# Patient Record
Sex: Female | Born: 1985 | Race: White | Hispanic: No | Smoking: Former smoker
Health system: Southern US, Community
[De-identification: ages and names within clinical notes are randomized; demographics above are authoritative.]

## PROBLEM LIST (undated history)

## (undated) ENCOUNTER — Inpatient Hospital Stay (HOSPITAL_COMMUNITY): Payer: Self-pay

## (undated) DIAGNOSIS — D649 Anemia, unspecified: Secondary | ICD-10-CM

## (undated) DIAGNOSIS — F411 Generalized anxiety disorder: Secondary | ICD-10-CM

## (undated) DIAGNOSIS — F32A Depression, unspecified: Secondary | ICD-10-CM

## (undated) HISTORY — PX: NO PAST SURGERIES: SHX2092

## (undated) HISTORY — DX: Generalized anxiety disorder: F41.1

## (undated) HISTORY — DX: Depression, unspecified: F32.A

---

## 2014-08-01 NOTE — L&D Delivery Note (Signed)
Delivery Note At 5:31 PM a viable female was delivered via  (Presentation: ;  ).  APGAR: , ; weight  .   Placenta status: , .  Cord:  with the following complications: .  Cord pH: not done  Anesthesia:   Episiotomy:   Lacerations:   Suture Repair: 2.0 vicryl Est. Blood Loss (mL):    Mom to postpartum.  Baby to Couplet care / Skin to Skin.  MARSHALL,BERNARD A 02/06/2015, 5:40 PM

## 2014-08-20 LAB — OB RESULTS CONSOLE RPR: RPR: NONREACTIVE

## 2014-08-20 LAB — OB RESULTS CONSOLE ABO/RH: RH Type: POSITIVE

## 2014-08-20 LAB — OB RESULTS CONSOLE GC/CHLAMYDIA
Chlamydia: NEGATIVE
Gonorrhea: NEGATIVE

## 2014-08-20 LAB — OB RESULTS CONSOLE RUBELLA ANTIBODY, IGM: RUBELLA: IMMUNE

## 2014-08-20 LAB — OB RESULTS CONSOLE HIV ANTIBODY (ROUTINE TESTING): HIV: NONREACTIVE

## 2014-08-20 LAB — OB RESULTS CONSOLE HEPATITIS B SURFACE ANTIGEN: HEP B S AG: NEGATIVE

## 2014-08-20 LAB — OB RESULTS CONSOLE ANTIBODY SCREEN: Antibody Screen: NEGATIVE

## 2014-12-30 LAB — OB RESULTS CONSOLE GBS: GBS: NEGATIVE

## 2015-02-03 ENCOUNTER — Inpatient Hospital Stay (HOSPITAL_COMMUNITY)
Admission: AD | Admit: 2015-02-03 | Discharge: 2015-02-03 | Disposition: A | Discharge status: Home / Self Care | Payer: Medicaid Other | Source: Ambulatory Visit | Attending: Obstetrics | Admitting: Obstetrics

## 2015-02-03 ENCOUNTER — Encounter (HOSPITAL_COMMUNITY): Payer: Self-pay | Admitting: *Deleted

## 2015-02-03 ENCOUNTER — Inpatient Hospital Stay (HOSPITAL_COMMUNITY): Payer: Medicaid Other

## 2015-02-03 DIAGNOSIS — Z3A40 40 weeks gestation of pregnancy: Secondary | ICD-10-CM | POA: Diagnosis not present

## 2015-02-03 DIAGNOSIS — O368130 Decreased fetal movements, third trimester, not applicable or unspecified: Secondary | ICD-10-CM

## 2015-02-03 DIAGNOSIS — O368190 Decreased fetal movements, unspecified trimester, not applicable or unspecified: Secondary | ICD-10-CM

## 2015-02-03 HISTORY — DX: Anemia, unspecified: D64.9

## 2015-02-03 IMAGING — US US FETAL BPP W/O NONSTRESS
1 series · 14 of 28 positions shown · non-contrast
Comparison: none

[Series 1: us fetal bpp w/o nonstress · non-contrast · 35 acquisitions, 14 frames shown]
[im 2/35]
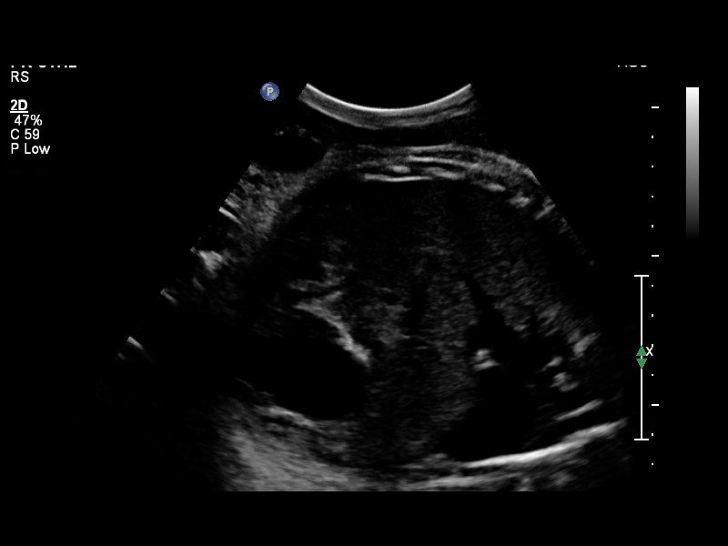
[im 4/35]
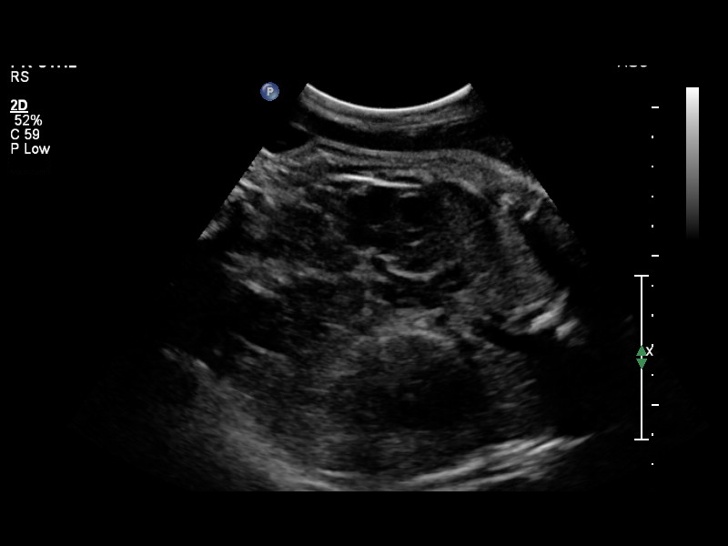
[im 7/35]
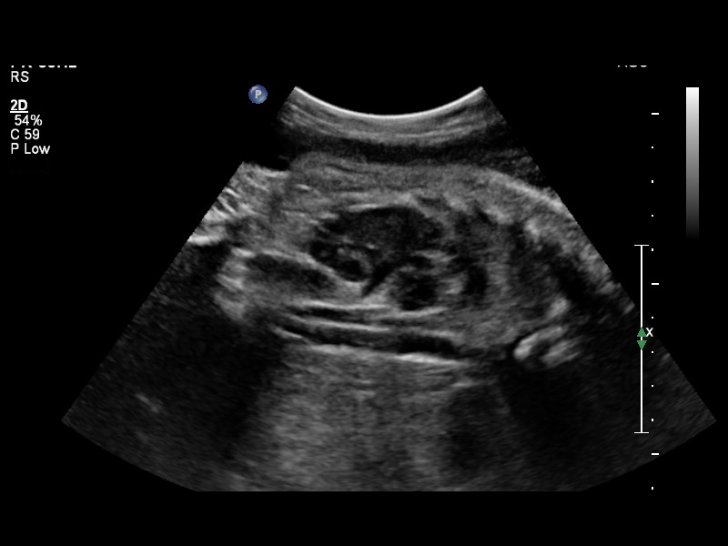
[im 9/35]
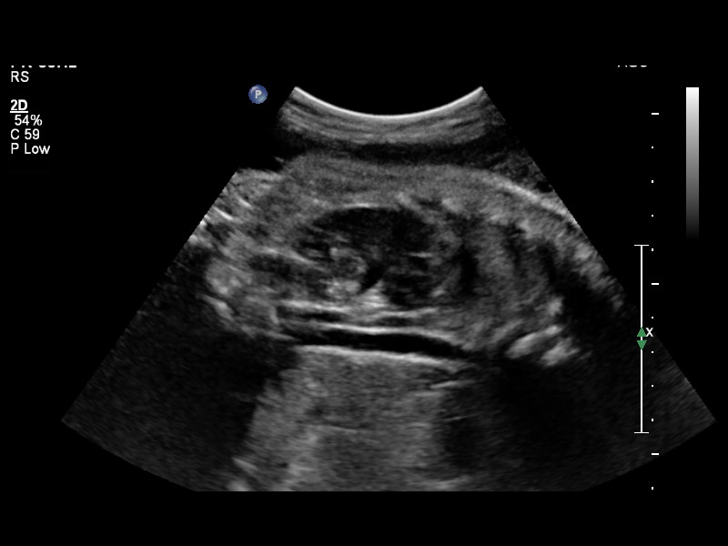
[im 12/35]
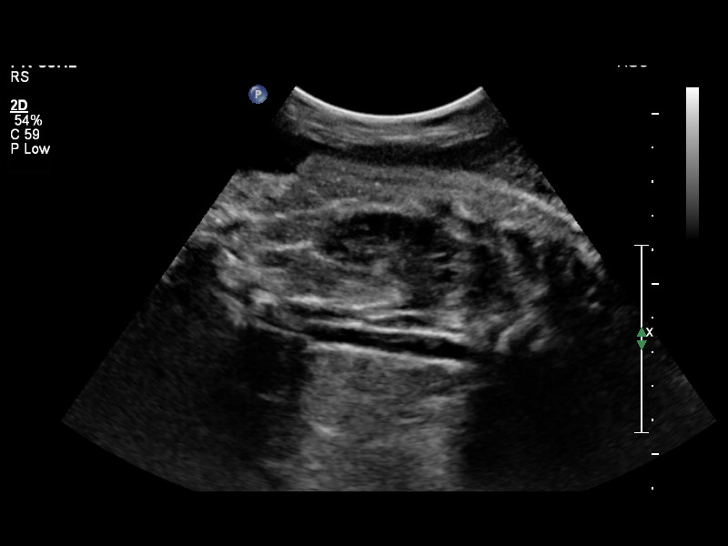
[im 14/35]
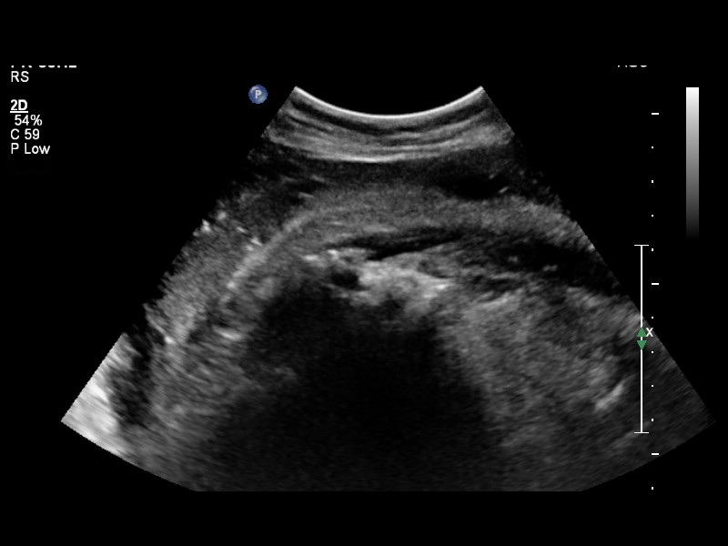
[im 17/35]
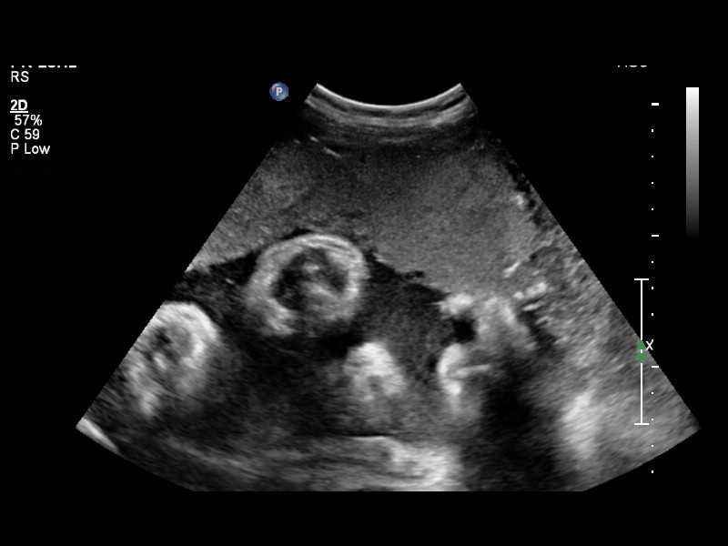
[im 19/35]
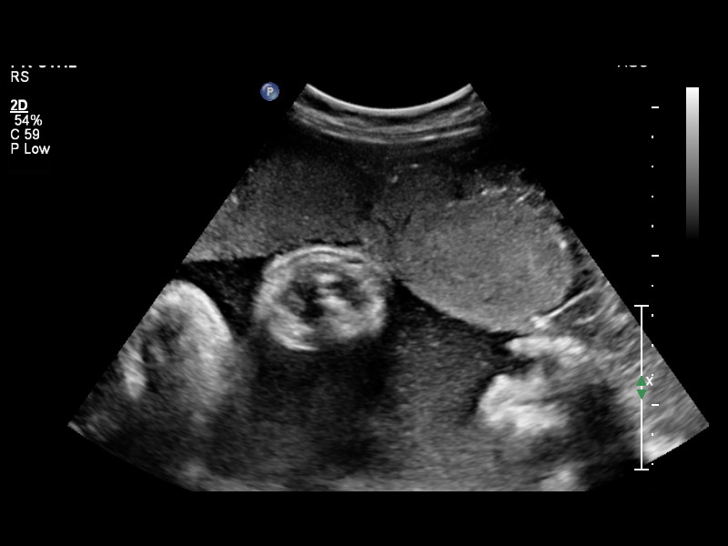
[im 22/35]
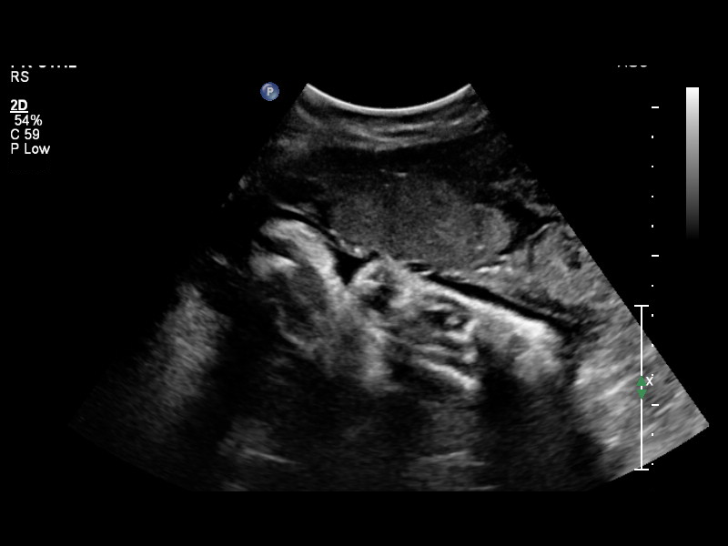
[im 24/35]
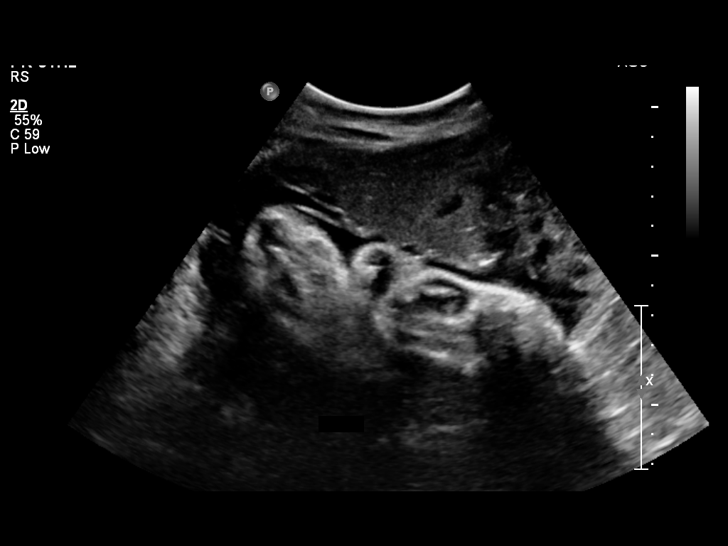
[im 27/35]
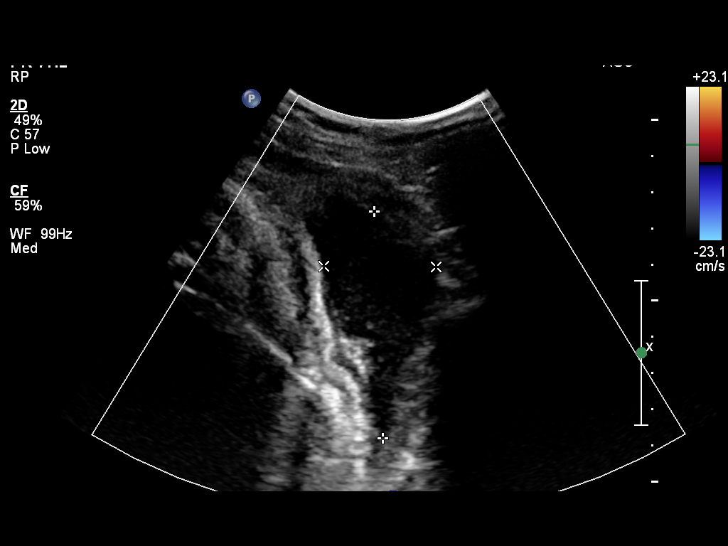
[im 29/35]
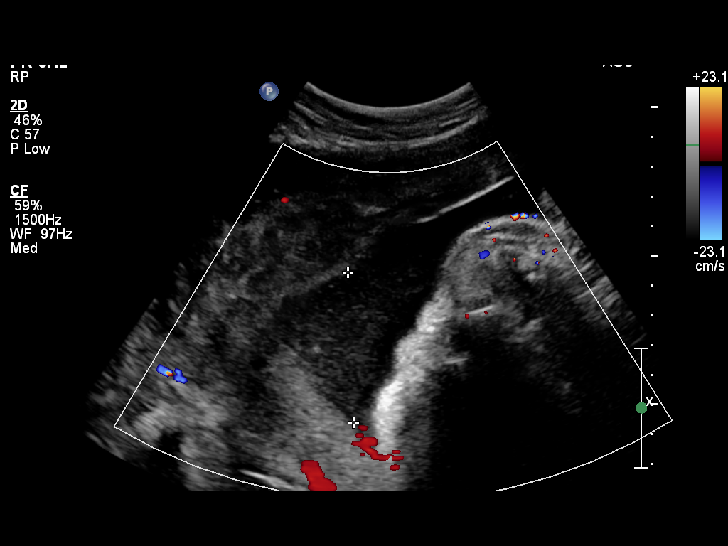
[im 32/35]
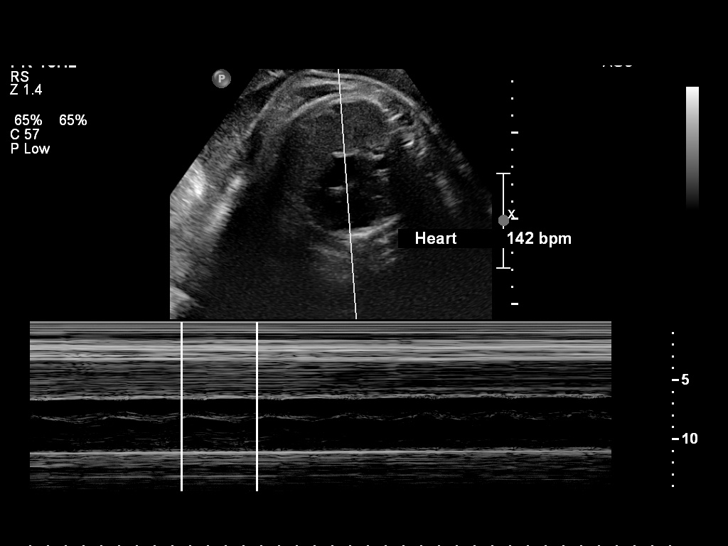
[im 35/35]
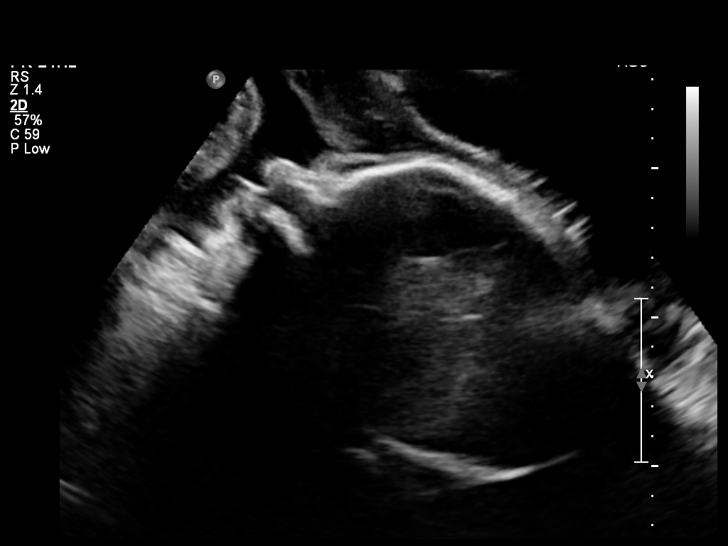

[14 of 28 positions shown; findings below may reference images not displayed]

OBSTETRICS REPORT
(Signed Final [DATE] [DATE])

Service(s) Provided

Indications

40 weeks gestation of pregnancy
Decreased fetal movements, third trimester,           [TS]
unspecified
Postdate pregnancy (40-42 weeks)                      [TS]
Fetal Evaluation

Num Of Fetuses:    1
Fetal Heart Rate:  142                          bpm
Cardiac Activity:  Observed
Presentation:      Cephalic

Amniotic Fluid
AFI FV:      Subjectively within normal limits
AFI Sum:     16.22    cm      70  %Tile      Larg Pckt:   6.23  cm
RUQ:   4.96    cm   RLQ:    5.03   cm    LLQ:    6.23   cm
Biophysical Evaluation

Amniotic F.V:   Pocket => 2 cm two         F. Tone:         Observed
planes
F. Movement:    Observed                   Score:           [DATE]
F. Breathing:   Observed
Gestational Age

Clinical EDD:  40w 1d                                        EDD:    [DATE]
Best:          40w 1d     Det. By:  Clinical EDD             EDD:    [DATE]
Impression

SIUP at 40+1 weeks
Cephalic presentation
Normal amniotic fluid volume
BPP [DATE]
Recommendations

Follow-up as clinically indicated
questions or concerns.

## 2015-02-03 NOTE — MAU Provider Note (Signed)
  History     CSN: [id number]  Arrival date and time: 02/03/15 1655   First Provider Initiated Contact with Patient 02/03/15 1732      Chief Complaint  Patient presents with  . Decreased Fetal Movement   HPI  29 y.o. [id number] @[redacted]w[redacted]d  presents to the MAU stating that she has not felt the baby move all day until she came to the hospital. Denies vaginal bleeding, LOF, contractions.  Past Medical History  Diagnosis Date  . Anemia     Past Surgical History  Procedure Laterality Date  . No past surgeries      History reviewed. No pertinent family history.  History  Substance Use Topics  . Smoking status: Never Smoker   . Smokeless tobacco: Not on file  . Alcohol Use: No    Allergies: No Known Allergies  Prescriptions prior to admission  Medication Sig Dispense Refill Last Dose  . calcium carbonate (TUMS - DOSED IN MG ELEMENTAL CALCIUM) 500 MG chewable tablet Chew 2 tablets by mouth 2 (two) times daily as needed for indigestion or heartburn.   Past Week at Unknown time  . ferrous sulfate 325 (65 FE) MG tablet Take 325 mg by mouth daily with supper.   02/02/2015 at Unknown time  . Prenatal Vit-Fe Fumarate-FA (PRENATAL MULTIVITAMIN) TABS tablet Take 1 tablet by mouth daily with supper.   02/02/2015 at Unknown time    Review of Systems  Constitutional: Negative for fever.  Genitourinary:       Decreased fetal movement  All other systems reviewed and are negative.  Physical Exam   Blood pressure 106/61, pulse 89, temperature 98.9 F (37.2 C), temperature source Oral, resp. rate 20.  Physical Exam  Nursing note and vitals reviewed. Constitutional: She is oriented to person, place, and time. She appears well-developed and well-nourished. No distress.  HENT:  Head: Normocephalic and atraumatic.  Cardiovascular: Normal rate.   Respiratory: Effort normal. No respiratory distress.  Musculoskeletal: Normal range of motion.  Neurological: She is alert and oriented to person,  place, and time.  Skin: Skin is warm and dry.  Psychiatric: She has a normal mood and affect. Her behavior is normal. Judgment and thought content normal.    MAU Course  Procedures MDM Nst- Reactive Category 1 occ mild variables . Reported to Dr. Gaynell FaceMarshall. Bpp 8/8  Assessment and Plan  Decreased fetal movement  Discharge to home  [facility] HospitalClemmons,Ercie Eliasen Grissett 02/03/2015, 5:46 PM

## 2015-02-03 NOTE — Discharge Instructions (Signed)
Fetal Biophysical Profile °This is a test that measures five different variables of the fetus: Heart rate, breathing movement, total movement of the baby, fetal muscle tone, the amount of amniotic fluid, and the heart rate activity of the fetus. The five variables are measured individually and contribute either a 2 or a 0 to the overall scoring of the test. The measurements are as follows: °· Fetal heart rate activity. This is measured and scored in the same way as a non-stress test. The fetal heart rate is considered reactive when there are movement-associated fetal heart rate increases of at least 15 beats per minute above baseline, and 15 seconds in duration over a 20-minute period. A score of 2 is given for reactivity, and a score of 0 indicates that the fetal heart rate is non-reactive. °· Fetal breathing movements. This is scored based on fetal breathing movements and indicate fetal well-being. Their absence may indicate a low oxygen level for the fetus. Fetal breathing increases in frequency and uniformity after the 36th week of pregnancy. To earn a score of 2, the fetus must have at least one episode of fetal breathing lasting at least 60 seconds within a 30-minute observation. Absence of this breathing is scored a 0 on the BPP. °· Fetal body movements. Fetal activity is a reflection of brain integrity and function. The presence of at least three episodes of fetal movements within a 30-minute period is given a score of 2. A score of 0 is given with two or less movements in this time period. Fetal activity is highest 1 to 3 hours after the mother has eaten a meal. °· Fetal tone. In the uterus, the fetus is normally in a position of flexion. This means the head is bent down towards the knees. The fetus also stretches, rolls, and moves in the uterus. The arms, legs, trunk, and head may be flexed and extended. A score of 2 is earned when there is at least one episode of active extension with return flexion. A  score of 0 is given for slow extension with a return to only partial flexion. Fetal movement not followed by return to flexion, limbs or spine in extension, and an open fetal hand score 0. °· Amniotic fluid volume. Amniotic fluid volume has been demonstrated to be a good method of predicting fetal distress. Too little amniotic fluid has been associated with fetal abnormalities, slow uterine growth, and over due pregnancy. A score of 2 is given for this when there is at least one pocket of amniotic fluid that measures 1 cm in a specific area. A score of 0 indicates either that fluid is absent in most areas of the uterine cavity or that the largest pocket of fluid measures less than 1 cm. °PREPARATION FOR TEST °No preparation or fasting is necessary. °NORMAL FINDINGS °1. A score of 8-10 points (if amniotic fluid volume is adequate). °2. Possible critical values: Less than 4 may necessitate immediate delivery of fetus. °Ranges for normal findings may vary among different laboratories and hospitals. You should always check with your doctor after having lab work or other tests done to discuss the meaning of your test results and whether your values are considered within normal limits. °MEANING OF TEST  °Your caregiver will go over the test results with you and discuss the importance and meaning of your results, as well as treatment options and the need for additional tests if necessary. °OBTAINING THE TEST RESULTS  °It is your responsibility to obtain your test   results. Ask the lab or department performing the test when and how you will get your results. °Document Released: 11/18/2004 Document Revised: 10/10/2011 Document Reviewed: 06/27/2008 °[employer]® Patient Information ©2015 [employer]. This information is not intended to replace advice given to you by your health care provider. Make sure you discuss any questions you have with your health care provider. ° °Fetal Movement Counts °Patient Name:  __________________________________________________ Patient Due Date: ____________________ °Performing a fetal movement count is highly recommended in high-risk pregnancies, but it is good for every pregnant woman to do. Your health care provider may ask you to start counting fetal movements at 28 weeks of the pregnancy. Fetal movements often increase: °· After eating a full meal. °· After physical activity. °· After eating or drinking something sweet or cold. °· At rest. °Pay attention to when you feel the baby is most active. This will help you notice a pattern of your baby's sleep and wake cycles and what factors contribute to an increase in fetal movement. It is important to perform a fetal movement count at the same time each day when your baby is normally most active.  °HOW TO COUNT FETAL MOVEMENTS °3. Find a quiet and comfortable area to sit or lie down on your left side. Lying on your left side provides the best blood and oxygen circulation to your baby. °4. Write down the day and time on a sheet of paper or in a journal. °5. Start counting kicks, flutters, swishes, rolls, or jabs in a 2-hour period. You should feel at least 10 movements within 2 hours. °6. If you do not feel 10 movements in 2 hours, wait 2-3 hours and count again. Look for a change in the pattern or not enough counts in 2 hours. °SEEK MEDICAL CARE IF: °· You feel less than 10 counts in 2 hours, tried twice. °· There is no movement in over an hour. °· The pattern is changing or taking longer each day to reach 10 counts in 2 hours. °· You feel the baby is not moving as he or she usually does. °Date: ____________ Movements: ____________ Start time: ____________ Finish time: ____________  °Date: ____________ Movements: ____________ Start time: ____________ Finish time: ____________ °Date: ____________ Movements: ____________ Start time: ____________ Finish time: ____________ °Date: ____________ Movements: ____________ Start time: ____________  Finish time: ____________ °Date: ____________ Movements: ____________ Start time: ____________ Finish time: ____________ °Date: ____________ Movements: ____________ Start time: ____________ Finish time: ____________ °Date: ____________ Movements: ____________ Start time: ____________ Finish time: ____________ °Date: ____________ Movements: ____________ Start time: ____________ Finish time: ____________  °Date: ____________ Movements: ____________ Start time: ____________ Finish time: ____________ °Date: ____________ Movements: ____________ Start time: ____________ Finish time: ____________ °Date: ____________ Movements: ____________ Start time: ____________ Finish time: ____________ °Date: ____________ Movements: ____________ Start time: ____________ Finish time: ____________ °Date: ____________ Movements: ____________ Start time: ____________ Finish time: ____________ °Date: ____________ Movements: ____________ Start time: ____________ Finish time: ____________ °Date: ____________ Movements: ____________ Start time: ____________ Finish time: ____________  °Date: ____________ Movements: ____________ Start time: ____________ Finish time: ____________ °Date: ____________ Movements: ____________ Start time: ____________ Finish time: ____________ °Date: ____________ Movements: ____________ Start time: ____________ Finish time: ____________ °Date: ____________ Movements: ____________ Start time: ____________ Finish time: ____________ °Date: ____________ Movements: ____________ Start time: ____________ Finish time: ____________ °Date: ____________ Movements: ____________ Start time: ____________ Finish time: ____________ °Date: ____________ Movements: ____________ Start time: ____________ Finish time: ____________  °Date: ____________ Movements: ____________ Start time: ____________ Finish time: ____________ °Date: ____________ Movements: ____________ Start time: ____________   Finish time: ____________ °Date: ____________  Movements: ____________ Start time: ____________ Finish time: ____________ °Date: ____________ Movements: ____________ Start time: ____________ Finish time: ____________ °Date: ____________ Movements: ____________ Start time: ____________ Finish time: ____________ °Date: ____________ Movements: ____________ Start time: ____________ Finish time: ____________ °Date: ____________ Movements: ____________ Start time: ____________ Finish time: ____________  °Date: ____________ Movements: ____________ Start time: ____________ Finish time: ____________ °Date: ____________ Movements: ____________ Start time: ____________ Finish time: ____________ °Date: ____________ Movements: ____________ Start time: ____________ Finish time: ____________ °Date: ____________ Movements: ____________ Start time: ____________ Finish time: ____________ °Date: ____________ Movements: ____________ Start time: ____________ Finish time: ____________ °Date: ____________ Movements: ____________ Start time: ____________ Finish time: ____________ °Date: ____________ Movements: ____________ Start time: ____________ Finish time: ____________  °Date: ____________ Movements: ____________ Start time: ____________ Finish time: ____________ °Date: ____________ Movements: ____________ Start time: ____________ Finish time: ____________ °Date: ____________ Movements: ____________ Start time: ____________ Finish time: ____________ °Date: ____________ Movements: ____________ Start time: ____________ Finish time: ____________ °Date: ____________ Movements: ____________ Start time: ____________ Finish time: ____________ °Date: ____________ Movements: ____________ Start time: ____________ Finish time: ____________ °Date: ____________ Movements: ____________ Start time: ____________ Finish time: ____________  °Date: ____________ Movements: ____________ Start time: ____________ Finish time: ____________ °Date: ____________ Movements: ____________ Start time:  ____________ Finish time: ____________ °Date: ____________ Movements: ____________ Start time: ____________ Finish time: ____________ °Date: ____________ Movements: ____________ Start time: ____________ Finish time: ____________ °Date: ____________ Movements: ____________ Start time: ____________ Finish time: ____________ °Date: ____________ Movements: ____________ Start time: ____________ Finish time: ____________ °Date: ____________ Movements: ____________ Start time: ____________ Finish time: ____________  °Date: ____________ Movements: ____________ Start time: ____________ Finish time: ____________ °Date: ____________ Movements: ____________ Start time: ____________ Finish time: ____________ °Date: ____________ Movements: ____________ Start time: ____________ Finish time: ____________ °Date: ____________ Movements: ____________ Start time: ____________ Finish time: ____________ °Date: ____________ Movements: ____________ Start time: ____________ Finish time: ____________ °Date: ____________ Movements: ____________ Start time: ____________ Finish time: ____________ °Document Released: 08/17/2006 Document Revised: 12/02/2013 Document Reviewed: 05/14/2012 °[employer]® Patient Information ©2015 [employer]. This information is not intended to replace advice given to you by your health care provider. Make sure you discuss any questions you have with your health care provider. ° °

## 2015-02-03 NOTE — MAU Note (Signed)
Pt states her husband picked her up, then didn't feel FM for an hour.  Called nurse @ Dr. Elsie StainMarshall's office, felt movement while on the phone but was advised to still come to MAU.  Has occasional uc's, denies bleeding or LOF.  Reports good FM now.

## 2015-02-06 ENCOUNTER — Encounter (HOSPITAL_COMMUNITY): Payer: Self-pay

## 2015-02-06 ENCOUNTER — Inpatient Hospital Stay (HOSPITAL_COMMUNITY)
Admission: RE | Admit: 2015-02-06 | Discharge: 2015-02-08 | DRG: 775 | Disposition: A | Discharge status: Home / Self Care | Payer: Medicaid Other | Source: Ambulatory Visit | Attending: Obstetrics | Admitting: Obstetrics

## 2015-02-06 DIAGNOSIS — Z3490 Encounter for supervision of normal pregnancy, unspecified, unspecified trimester: Secondary | ICD-10-CM

## 2015-02-06 DIAGNOSIS — Z3A40 40 weeks gestation of pregnancy: Secondary | ICD-10-CM | POA: Diagnosis present

## 2015-02-06 DIAGNOSIS — O9989 Other specified diseases and conditions complicating pregnancy, childbirth and the puerperium: Secondary | ICD-10-CM | POA: Diagnosis present

## 2015-02-06 LAB — CBC
HCT: 39.6 % (ref 36.0–46.0)
HEMOGLOBIN: 13.6 g/dL (ref 12.0–15.0)
MCH: 32.8 pg (ref 26.0–34.0)
MCHC: 34.3 g/dL (ref 30.0–36.0)
MCV: 95.4 fL (ref 78.0–100.0)
Platelets: 106 10*3/uL — ABNORMAL LOW (ref 150–400)
RBC: 4.15 MIL/uL (ref 3.87–5.11)
RDW: 13.6 % (ref 11.5–15.5)
WBC: 8.8 10*3/uL (ref 4.0–10.5)

## 2015-02-06 LAB — ABO/RH: ABO/RH(D): O POS

## 2015-02-06 LAB — TYPE AND SCREEN
ABO/RH(D): O POS
Antibody Screen: NEGATIVE

## 2015-02-06 LAB — RPR: RPR Ser Ql: NONREACTIVE

## 2015-02-06 MED ORDER — ONDANSETRON HCL 4 MG PO TABS
4.0000 mg | ORAL_TABLET | ORAL | Status: DC | PRN
Start: 2015-02-06 — End: 2015-02-08

## 2015-02-06 MED ORDER — BENZOCAINE-MENTHOL 20-0.5 % EX AERO: 1.0000 "application " | INHALATION_SPRAY | CUTANEOUS | Status: DC | PRN

## 2015-02-06 MED ORDER — OXYCODONE-ACETAMINOPHEN 5-325 MG PO TABS
2.0000 | ORAL_TABLET | ORAL | Status: DC | PRN
Start: 2015-02-06 — End: 2015-02-08

## 2015-02-06 MED ORDER — FLEET ENEMA 7-19 GM/118ML RE ENEM: 1.0000 | ENEMA | RECTAL | Status: DC | PRN

## 2015-02-06 MED ORDER — CITRIC ACID-SODIUM CITRATE 334-500 MG/5ML PO SOLN: 30.0000 mL | ORAL | Status: DC | PRN

## 2015-02-06 MED ORDER — LACTATED RINGERS IV SOLN: 500.0000 mL | INTRAVENOUS | Status: DC | PRN

## 2015-02-06 MED ORDER — LANOLIN HYDROUS EX OINT: TOPICAL_OINTMENT | CUTANEOUS | Status: DC | PRN

## 2015-02-06 MED ORDER — TERBUTALINE SULFATE 1 MG/ML IJ SOLN
0.2500 mg | Freq: Once | INTRAMUSCULAR | Status: DC | PRN
Start: 2015-02-06 — End: 2015-02-06
  Filled 2015-02-06: qty 1

## 2015-02-06 MED ORDER — OXYTOCIN BOLUS FROM INFUSION
500.0000 mL | INTRAVENOUS | Status: DC
  Administered 2015-02-06: 500 mL via INTRAVENOUS

## 2015-02-06 MED ORDER — OXYCODONE-ACETAMINOPHEN 5-325 MG PO TABS: 1.0000 | ORAL_TABLET | ORAL | Status: DC | PRN

## 2015-02-06 MED ORDER — SENNOSIDES-DOCUSATE SODIUM 8.6-50 MG PO TABS
2.0000 | ORAL_TABLET | ORAL | Status: DC
  Administered 2015-02-07 (×2): 2 via ORAL
  Filled 2015-02-06 (×2): qty 2

## 2015-02-06 MED ORDER — DIBUCAINE 1 % RE OINT: 1.0000 "application " | TOPICAL_OINTMENT | RECTAL | Status: DC | PRN

## 2015-02-06 MED ORDER — LIDOCAINE HCL (PF) 1 % IJ SOLN
30.0000 mL | INTRAMUSCULAR | Status: DC | PRN
  Administered 2015-02-06: 30 mL via SUBCUTANEOUS
  Filled 2015-02-06: qty 30

## 2015-02-06 MED ORDER — TETANUS-DIPHTH-ACELL PERTUSSIS 5-2.5-18.5 LF-MCG/0.5 IM SUSP
0.5000 mL | Freq: Once | INTRAMUSCULAR | Status: AC
  Administered 2015-02-07: 0.5 mL via INTRAMUSCULAR

## 2015-02-06 MED ORDER — OXYCODONE-ACETAMINOPHEN 5-325 MG PO TABS: 2.0000 | ORAL_TABLET | ORAL | Status: DC | PRN

## 2015-02-06 MED ORDER — DIPHENHYDRAMINE HCL 25 MG PO CAPS: 25.0000 mg | ORAL_CAPSULE | Freq: Four times a day (QID) | ORAL | Status: DC | PRN

## 2015-02-06 MED ORDER — PRENATAL MULTIVITAMIN CH
1.0000 | ORAL_TABLET | Freq: Every day | ORAL | Status: DC
  Administered 2015-02-07 – 2015-02-08 (×2): 1 via ORAL
  Filled 2015-02-06 (×2): qty 1

## 2015-02-06 MED ORDER — ACETAMINOPHEN 325 MG PO TABS
650.0000 mg | ORAL_TABLET | ORAL | Status: DC | PRN
Start: 2015-02-06 — End: 2015-02-08

## 2015-02-06 MED ORDER — SIMETHICONE 80 MG PO CHEW: 80.0000 mg | CHEWABLE_TABLET | ORAL | Status: DC | PRN

## 2015-02-06 MED ORDER — FERROUS SULFATE 325 (65 FE) MG PO TABS
325.0000 mg | ORAL_TABLET | Freq: Two times a day (BID) | ORAL | Status: DC
  Administered 2015-02-07 – 2015-02-08 (×3): 325 mg via ORAL
  Filled 2015-02-06 (×3): qty 1

## 2015-02-06 MED ORDER — BUTORPHANOL TARTRATE 1 MG/ML IJ SOLN
1.0000 mg | INTRAMUSCULAR | Status: DC | PRN
  Administered 2015-02-06 (×2): 1 mg via INTRAVENOUS
  Filled 2015-02-06 (×2): qty 1

## 2015-02-06 MED ORDER — ACETAMINOPHEN 325 MG PO TABS: 650.0000 mg | ORAL_TABLET | ORAL | Status: DC | PRN

## 2015-02-06 MED ORDER — WITCH HAZEL-GLYCERIN EX PADS: 1.0000 "application " | MEDICATED_PAD | CUTANEOUS | Status: DC | PRN

## 2015-02-06 MED ORDER — ONDANSETRON HCL 4 MG/2ML IJ SOLN: 4.0000 mg | INTRAMUSCULAR | Status: DC | PRN

## 2015-02-06 MED ORDER — OXYTOCIN 40 UNITS IN LACTATED RINGERS INFUSION - SIMPLE MED
1.0000 m[IU]/min | INTRAVENOUS | Status: DC
  Administered 2015-02-06: 2 m[IU]/min via INTRAVENOUS
  Filled 2015-02-06: qty 1000

## 2015-02-06 MED ORDER — OXYTOCIN 40 UNITS IN LACTATED RINGERS INFUSION - SIMPLE MED: 62.5000 mL/h | INTRAVENOUS | Status: DC

## 2015-02-06 MED ORDER — ZOLPIDEM TARTRATE 5 MG PO TABS: 5.0000 mg | ORAL_TABLET | Freq: Every evening | ORAL | Status: DC | PRN

## 2015-02-06 MED ORDER — LACTATED RINGERS IV SOLN
INTRAVENOUS | Status: DC
  Administered 2015-02-06 (×2): via INTRAVENOUS

## 2015-02-06 MED ORDER — ONDANSETRON HCL 4 MG/2ML IJ SOLN: 4.0000 mg | Freq: Four times a day (QID) | INTRAMUSCULAR | Status: DC | PRN

## 2015-02-06 MED ORDER — IBUPROFEN 600 MG PO TABS
600.0000 mg | ORAL_TABLET | Freq: Four times a day (QID) | ORAL | Status: DC
  Administered 2015-02-06 – 2015-02-08 (×8): 600 mg via ORAL
  Filled 2015-02-06 (×8): qty 1

## 2015-02-06 NOTE — H&P (Signed)
This is Dr. Francoise CeoBernard Marshall dictating the history and physical on  Carrie Bell she is a 29 year old gravida 3 para 2002 at 40 weeks and 4 days negative GBS brought in for induction uneventful prenatal course cervix is 3 cm 70% vertex -3 amniotomy performed fluids clear and she is on low-dose Pitocin Past medical history is been negative Past surgical history negative Social history denies smoking drinking or alcohol use Family history negative System review noncontributory Physical exam well-developed female in early labor HEENT negative Lungs clear to P&A Heart regular rhythm no murmurs no gallops Breasts negative Abdomen term Pelvic as described above Extremities negative

## 2015-02-07 LAB — CBC
HEMATOCRIT: 38.7 % (ref 36.0–46.0)
Hemoglobin: 13.3 g/dL (ref 12.0–15.0)
MCH: 32.7 pg (ref 26.0–34.0)
MCHC: 34.4 g/dL (ref 30.0–36.0)
MCV: 95.1 fL (ref 78.0–100.0)
PLATELETS: 111 10*3/uL — AB (ref 150–400)
RBC: 4.07 MIL/uL (ref 3.87–5.11)
RDW: 13.5 % (ref 11.5–15.5)
WBC: 15 10*3/uL — AB (ref 4.0–10.5)

## 2015-02-07 NOTE — Lactation Note (Signed)
This note was copied from the chart of Carrie Bell. Lactation Consultation Note Experienced BF mom BF her other children for 6 months each, stated they weaned themselves.  Mom stated BF going great, baby latching well. Noted good feedings and output.  Mom encouraged to feed baby 8-12 times/24 hours and with feeding cues. Educated about newborn behavior, I&O, supply and demand. Mom encouraged to do skin-to-skin.Referred to Baby and Me Book in Breastfeeding section Pg. 22-23 for position options and Proper latch demonstration. WH/LC brochure given w/resources, support groups and LC services.     Patient Name: Carrie Leander RamsShawna Eilers ZOXWR'UToday's Date: 02/07/2015 Reason for consult: Initial assessment   Maternal Data Has patient been taught Hand Expression?: Yes Does the patient have breastfeeding experience prior to this delivery?: Yes  Feeding Feeding Type: Breast Fed Length of feed: 45 min  LATCH Score/Interventions       Type of Nipple: Everted at rest and after stimulation  Comfort (Breast/Nipple): Soft / non-tender     Hold (Positioning): No assistance needed to correctly position infant at breast.     Lactation Tools Discussed/Used     Consult Status Consult Status: PRN Date: 02/08/15 Follow-up type: In-patient    Charyl DancerCARVER, Liyana Suniga G 02/07/2015, 2:54 PM

## 2015-02-07 NOTE — Progress Notes (Signed)
Patient ID: Carrie Bell, female   DOB: 08/07/1985, 29 y.o.   MRN: [id number] Postpartum day 1 Blood pressure 110/68 respiration 16 pulse 90 Fundus firm Lochia moderate Legs negative doing well

## 2015-02-08 NOTE — Discharge Instructions (Signed)
Discharge instructions   You can wash your hair  Shower  Eat what you want  Drink what you want  See me in 6 weeks  Your ankles are going to swell more in the next 2 weeks than when pregnant  No sex for 6 weeks   Isaac Lacson A, MD 02/08/2015

## 2015-02-08 NOTE — Discharge Summary (Signed)
Obstetric Discharge Summary Reason for Admission: induction of labor Prenatal Procedures: none Intrapartum Procedures: spontaneous vaginal delivery Postpartum Procedures: none Complications-Operative and Postpartum: none HEMOGLOBIN  Date Value Ref Range Status  02/07/2015 13.3 12.0 - 15.0 g/dL Final   HCT  Date Value Ref Range Status  02/07/2015 38.7 36.0 - 46.0 % Final    Physical Exam:  General: alert Lochia: appropriate Uterine Fundus: firm Incision: healing well DVT Evaluation: No evidence of DVT seen on physical exam.  Discharge Diagnoses: Term Pregnancy-delivered  Discharge Information: Date: 02/08/2015 Activity: pelvic rest Diet: routine Medications: Percocet Condition: stable Instructions: refer to practice specific booklet Discharge to: home Follow-up Information    Follow up with Kathreen CosierMARSHALL,Keaghan Bowens A, MD.   Specialty:  Obstetrics and Gynecology   Contact information:   [address] [place] [address] [phone]       Newborn Data: Live born female  Birth Weight: 9 lb 1.7 oz (4131 g) APGAR: 9, 9  Home with mother.  Carrie Bell A 02/08/2015, 6:56 AM

## 2015-03-19 LAB — PROCEDURE REPORT - SCANNED: Pap: NEGATIVE

## 2019-11-29 ENCOUNTER — Ambulatory Visit: Payer: Self-pay | Admitting: Family Medicine

## 2019-11-29 ENCOUNTER — Other Ambulatory Visit: Payer: Self-pay

## 2023-03-09 ENCOUNTER — Ambulatory Visit (INDEPENDENT_AMBULATORY_CARE_PROVIDER_SITE_OTHER): Payer: Medicaid Other | Admitting: Family Medicine

## 2023-03-09 ENCOUNTER — Encounter: Payer: Self-pay | Admitting: Family Medicine

## 2023-03-09 VITALS — BP 94/63 | HR 61 | Temp 98.7°F | Resp 18 | Ht 64.0 in | Wt 137.0 lb

## 2023-03-09 DIAGNOSIS — F419 Anxiety disorder, unspecified: Secondary | ICD-10-CM | POA: Insufficient documentation

## 2023-03-09 DIAGNOSIS — Z1329 Encounter for screening for other suspected endocrine disorder: Secondary | ICD-10-CM | POA: Insufficient documentation

## 2023-03-09 DIAGNOSIS — Z7689 Persons encountering health services in other specified circumstances: Secondary | ICD-10-CM

## 2023-03-09 DIAGNOSIS — F32A Depression, unspecified: Secondary | ICD-10-CM

## 2023-03-09 DIAGNOSIS — R5383 Other fatigue: Secondary | ICD-10-CM | POA: Diagnosis not present

## 2023-03-09 DIAGNOSIS — R634 Abnormal weight loss: Secondary | ICD-10-CM
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
## 2023-03-24 ENCOUNTER — Encounter: Payer: Self-pay | Admitting: Family Medicine

## 2023-03-27 ENCOUNTER — Other Ambulatory Visit: Payer: Self-pay | Admitting: Family Medicine

## 2023-03-27 DIAGNOSIS — F32A Depression, unspecified: Secondary | ICD-10-CM

## 2023-03-27 DIAGNOSIS — F419 Anxiety disorder, unspecified: Secondary | ICD-10-CM

## 2023-04-06 ENCOUNTER — Encounter: Payer: Self-pay | Admitting: Family Medicine

## 2023-04-06 ENCOUNTER — Ambulatory Visit (INDEPENDENT_AMBULATORY_CARE_PROVIDER_SITE_OTHER): Payer: Medicaid Other | Admitting: Family Medicine

## 2023-04-06 VITALS — BP 99/65 | HR 89 | Temp 98.7°F | Resp 18 | Ht 64.0 in | Wt 125.0 lb

## 2023-04-06 DIAGNOSIS — R5383 Other fatigue: Secondary | ICD-10-CM

## 2023-04-06 DIAGNOSIS — Z1329 Encounter for screening for other suspected endocrine disorder: Secondary | ICD-10-CM

## 2023-04-06 DIAGNOSIS — Z124 Encounter for screening for malignant neoplasm of cervix: Secondary | ICD-10-CM

## 2023-04-06 DIAGNOSIS — E611 Iron deficiency: Secondary | ICD-10-CM | POA: Diagnosis not present

## 2023-04-06 DIAGNOSIS — Z0000 Encounter for general adult medical examination without abnormal findings: Secondary | ICD-10-CM

## 2023-04-06 DIAGNOSIS — Z13220 Encounter for screening for lipoid disorders: Secondary | ICD-10-CM

## 2023-04-06 DIAGNOSIS — Z136 Encounter for screening for cardiovascular disorders: Secondary | ICD-10-CM

## 2023-04-06 DIAGNOSIS — Z13228 Encounter for screening for other metabolic disorders: Secondary | ICD-10-CM

## 2023-04-06 DIAGNOSIS — Z1159 Encounter for screening for other viral diseases: Secondary | ICD-10-CM | POA: Insufficient documentation

## 2023-04-06 MED ORDER — IRON (FERROUS SULFATE) 325 (65 FE) MG PO TABS
325.0000 mg | ORAL_TABLET | Freq: Every day | ORAL | 0 refills | Status: DC
Start: 2023-04-06 — End: 2023-09-06

## 2023-04-06 NOTE — Progress Notes (Signed)
Complete physical exam  Patient: Carrie Bell   DOB: 12-Dec-1985   37 y.o. Female  MRN: [id number]  Subjective:    Chief Complaint  Patient presents with   Annual Exam    Carrie Bell is a 37 y.o. female who presents today for a complete physical exam. She reports consuming a general diet.  Hiking and staying active.  She generally feels fairly well. She reports sleeping well. She does not have additional problems to discuss today.    Most recent fall risk assessment:    03/09/2023    3:18 PM  Fall Risk   Falls in the past year? 0  Number falls in past yr: 0  Injury with Fall? 0  Risk for fall due to : No Fall Risks  Follow up Falls evaluation completed     Most recent depression screenings:    03/09/2023    3:18 PM  PHQ 2/9 Scores  PHQ - 2 Score 2  PHQ- 9 Score 11  Denies thoughts of self-harm. Feels depressed around menstrual cycle.   Vision:Within last year and Dental: No current dental problems and No regular dental care     Patient Care Team: Novella Olive, FNP as PCP - General (Family Medicine)   No outpatient medications prior to visit.   No facility-administered medications prior to visit.    Review of Systems  Constitutional:  Positive for malaise/fatigue.  Respiratory:  Negative for shortness of breath.   Cardiovascular:  Negative for chest pain.  Psychiatric/Behavioral:  Positive for depression (a lot better than in past.). Negative for suicidal ideas. The patient does not have insomnia.           Objective:     BP 99/65 (BP Location: Left Arm, Patient Position: Sitting, Cuff Size: Normal)   Pulse 89   Temp 98.7 F (37.1 C) (Oral)   Resp 18   Ht 5\' 4"  (1.626 m)   Wt 125 lb (56.7 kg)   LMP  (LMP Unknown) Comment: periguard IUD  SpO2 100%   BMI 21.46 kg/m    Physical Exam Vitals and nursing note reviewed. Exam conducted with a chaperone present.  Constitutional:      General: She is not in acute distress.    Appearance: Normal  appearance.  HENT:     Right Ear: Tympanic membrane normal.     Left Ear: Tympanic membrane normal.     Nose: Nose normal.     Mouth/Throat:     Mouth: Mucous membranes are moist.     Pharynx: Oropharynx is clear.  Eyes:     Extraocular Movements: Extraocular movements intact.  Neck:     Thyroid: No thyroid tenderness.  Cardiovascular:     Rate and Rhythm: Normal rate and regular rhythm.     Pulses:          Radial pulses are 2+ on the right side and 2+ on the left side.     Heart sounds: Normal heart sounds, S1 normal and S2 normal.  Pulmonary:     Effort: Pulmonary effort is normal.     Breath sounds: Normal breath sounds.  Abdominal:     General: Bowel sounds are normal.     Palpations: Abdomen is soft.     Tenderness: There is no abdominal tenderness.  Genitourinary:    General: Normal vulva.     Exam position: Lithotomy position.     Vagina: Normal.     Cervix: Normal.     Adnexa: Right  adnexa normal and left adnexa normal.  Musculoskeletal:        General: Normal range of motion.     Cervical back: Normal range of motion.     Right lower leg: No edema.     Left lower leg: No edema.  Lymphadenopathy:     Cervical:     Right cervical: No superficial cervical adenopathy.    Left cervical: No superficial cervical adenopathy.  Skin:    General: Skin is warm and dry.  Neurological:     General: No focal deficit present.     Mental Status: She is alert. Mental status is at baseline.  Psychiatric:        Mood and Affect: Mood normal.        Behavior: Behavior normal.        Thought Content: Thought content normal.        Judgment: Judgment normal.      No results found for any visits on 04/06/23.     Assessment & Plan:    Routine Health Maintenance and Physical Exam  Immunization History  Administered Date(s) Administered   Moderna Sars-Covid-2 Vaccination 03/15/2020   Tdap 02/07/2015    Health Maintenance  Topic Date Due   Hepatitis C Screening  Never  done   PAP SMEAR-Modifier  03/18/2018   COVID-19 Vaccine (2 - 2023-24 season) 04/22/2023 (Originally 04/02/2023)   INFLUENZA VACCINE  10/30/2023 (Originally 03/02/2023)   DTaP/Tdap/Td (2 - Td or Tdap) 02/06/2025   HIV Screening  Completed   HPV VACCINES  Aged Out    Discussed health benefits of physical activity, and encouraged her to engage in regular exercise appropriate for her age and condition. Annual physical exam -     CBC with Differential/Platelet -     Comprehensive metabolic panel  Other fatigue -     Comprehensive metabolic panel -     Hemoglobin A1c -     TSH + free T4  Encounter for lipid screening for cardiovascular disease -     Lipid panel  Encounter for screening for metabolic disorder -     Hemoglobin A1c -     TSH + free T4  Need for hepatitis C screening test -     Hepatitis C antibody  Screening for thyroid disorder -     TSH + free T4  Screening for cervical cancer -     IGP, Aptima HPV  Screening for viral disease -     HIV Antibody (routine testing w rflx)  Iron deficiency -     Iron (Ferrous Sulfate); Take 325 mg by mouth daily.  Dispense: 90 tablet; Refill: 0    Ordered routine labs ordered.  HCM reviewed/discussed. Pap today. Hep C and HIV ordered.  Declines flu vaccine.  Anticipatory guidance regarding healthy weight, lifestyle and choices given. Recommend healthy diet.  Recommend approximately 150 minutes/week of moderate intensity exercise. Resistance training is good for building muscles and for bone health. Muscle mass helps to increase our metabolism and to burn more calories at rest.  Limit alcohol consumption: no more than one drink per day for women and 2 drinks per day for me. Recommend regular dental and vision exams. Always use seatbelt/lap and shoulder restraints. Recommend using smoke alarms and checking batteries at least twice a year. Recommend using sunscreen when outside.  Please know that I am here to help you with all of  your health care goals and am happy to work with you to find a  solution that works best for you.  The greatest advice I have received with any changes in life are to take it one step at a time, that even means if all you can focus on is the next 60 seconds, then do that and celebrate your victories.  With any changes in life, you will have set backs, and that is OK. The important thing to remember is, if you have a set back, it is not a failure, it is an opportunity to try again! Agrees with plan of care discussed.  Questions answered.      Return in about 3 months (around 07/06/2023) for iron deficiency .     Novella Olive, FNP

## 2023-04-07 LAB — HEPATITIS C ANTIBODY: Hep C Virus Ab: NONREACTIVE

## 2023-04-07 LAB — COMPREHENSIVE METABOLIC PANEL
ALT: 10 IU/L (ref 0–32)
AST: 18 IU/L (ref 0–40)
Albumin: 4.9 g/dL (ref 3.9–4.9)
Alkaline Phosphatase: 41 IU/L — ABNORMAL LOW (ref 44–121)
BUN/Creatinine Ratio: 17 (ref 9–23)
BUN: 13 mg/dL (ref 6–20)
Bilirubin Total: 0.6 mg/dL (ref 0.0–1.2)
CO2: 22 mmol/L (ref 20–29)
Calcium: 9.4 mg/dL (ref 8.7–10.2)
Chloride: 103 mmol/L (ref 96–106)
Creatinine, Ser: 0.77 mg/dL (ref 0.57–1.00)
Globulin, Total: 2.2 g/dL (ref 1.5–4.5)
Glucose: 84 mg/dL (ref 70–99)
Potassium: 4.9 mmol/L (ref 3.5–5.2)
Sodium: 140 mmol/L (ref 134–144)
Total Protein: 7.1 g/dL (ref 6.0–8.5)
eGFR: 102 mL/min/{1.73_m2} (ref >59)

## 2023-04-07 LAB — LIPID PANEL
Chol/HDL Ratio: 2.4 ratio (ref 0.0–4.4)
Cholesterol, Total: 133 mg/dL (ref 100–199)
HDL: 56 mg/dL (ref >39)
LDL Chol Calc (NIH): 66 mg/dL (ref 0–99)
Triglycerides: 50 mg/dL (ref 0–149)
VLDL Cholesterol Cal: 11 mg/dL (ref 5–40)

## 2023-04-07 LAB — HEMOGLOBIN A1C
Est. average glucose Bld gHb Est-mCnc: 103 mg/dL
Hgb A1c MFr Bld: 5.2 % (ref 4.8–5.6)

## 2023-04-07 LAB — CBC WITH DIFFERENTIAL/PLATELET
Basophils Absolute: 0 10*3/uL (ref 0.0–0.2)
Basos: 1 %
EOS (ABSOLUTE): 0.1 10*3/uL (ref 0.0–0.4)
Eos: 1 %
Hematocrit: 42.4 % (ref 34.0–46.6)
Hemoglobin: 13.6 g/dL (ref 11.1–15.9)
Immature Grans (Abs): 0 10*3/uL (ref 0.0–0.1)
Immature Granulocytes: 0 %
Lymphocytes Absolute: 1.3 10*3/uL (ref 0.7–3.1)
Lymphs: 22 %
MCH: 29 pg (ref 26.6–33.0)
MCHC: 32.1 g/dL (ref 31.5–35.7)
MCV: 90 fL (ref 79–97)
Monocytes Absolute: 0.4 10*3/uL (ref 0.1–0.9)
Monocytes: 7 %
Neutrophils Absolute: 4.3 10*3/uL (ref 1.4–7.0)
Neutrophils: 69 %
Platelets: 221 10*3/uL (ref 150–450)
RBC: 4.69 x10E6/uL (ref 3.77–5.28)
RDW: 12.9 % (ref 11.7–15.4)
WBC: 6.2 10*3/uL (ref 3.4–10.8)

## 2023-04-07 LAB — HIV ANTIBODY (ROUTINE TESTING W REFLEX): HIV Screen 4th Generation wRfx: NONREACTIVE

## 2023-04-07 LAB — TSH+FREE T4
Free T4: 1.35 ng/dL (ref 0.82–1.77)
TSH: 1.4 u[IU]/mL (ref 0.450–4.500)

## 2023-04-11 LAB — IGP, APTIMA HPV
HPV Aptima: NEGATIVE
PAP Smear Comment: 0

## 2023-04-28 ENCOUNTER — Ambulatory Visit (INDEPENDENT_AMBULATORY_CARE_PROVIDER_SITE_OTHER): Payer: Medicaid Other | Admitting: Licensed Clinical Social Worker

## 2023-04-28 ENCOUNTER — Encounter (HOSPITAL_COMMUNITY): Payer: Self-pay

## 2023-04-28 DIAGNOSIS — F4323 Adjustment disorder with mixed anxiety and depressed mood: Secondary | ICD-10-CM

## 2023-04-28 DIAGNOSIS — Z62819 Personal history of unspecified abuse in childhood: Secondary | ICD-10-CM
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
## 2023-06-06 ENCOUNTER — Ambulatory Visit (INDEPENDENT_AMBULATORY_CARE_PROVIDER_SITE_OTHER): Payer: Medicaid Other | Admitting: Licensed Clinical Social Worker

## 2023-06-06 DIAGNOSIS — Z62819 Personal history of unspecified abuse in childhood: Secondary | ICD-10-CM

## 2023-06-06 DIAGNOSIS — F4323 Adjustment disorder with mixed anxiety and depressed mood: Secondary | ICD-10-CM | POA: Diagnosis not present
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
## 2023-06-20 ENCOUNTER — Ambulatory Visit (INDEPENDENT_AMBULATORY_CARE_PROVIDER_SITE_OTHER): Payer: Medicaid Other | Admitting: Licensed Clinical Social Worker

## 2023-06-20 DIAGNOSIS — Z62819 Personal history of unspecified abuse in childhood: Secondary | ICD-10-CM | POA: Diagnosis not present

## 2023-06-20 DIAGNOSIS — F4323 Adjustment disorder with mixed anxiety and depressed mood: Secondary | ICD-10-CM | POA: Diagnosis not present
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
## 2023-07-04 ENCOUNTER — Ambulatory Visit (INDEPENDENT_AMBULATORY_CARE_PROVIDER_SITE_OTHER): Payer: Medicaid Other | Admitting: Licensed Clinical Social Worker

## 2023-07-04 DIAGNOSIS — Z62819 Personal history of unspecified abuse in childhood: Secondary | ICD-10-CM | POA: Diagnosis not present

## 2023-07-04 DIAGNOSIS — F4323 Adjustment disorder with mixed anxiety and depressed mood: Secondary | ICD-10-CM
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
## 2023-07-06 ENCOUNTER — Ambulatory Visit: Payer: Medicaid Other | Admitting: Family Medicine

## 2023-07-06 VITALS — BP 93/58 | HR 71 | Temp 98.6°F | Resp 18 | Ht 64.0 in | Wt 142.1 lb

## 2023-07-06 DIAGNOSIS — N921 Excessive and frequent menstruation with irregular cycle: Secondary | ICD-10-CM | POA: Diagnosis not present

## 2023-07-06 DIAGNOSIS — E611 Iron deficiency: Secondary | ICD-10-CM

## 2023-07-06 NOTE — Progress Notes (Signed)
   Established Patient Office Visit  Subjective   Patient ID: Carrie Bell, female    DOB: 03-09-1986  Age: 37 y.o. MRN: [id number]  Chief Complaint  Patient presents with   Medical Management of Chronic Issues    Patient is here to follow up for her Iron deficiency , she also would like to discuss her menstrual cycle starting earlier than normal    HPI Follow-up for iron deficiency. Has been taking everyday before period. When she is not on menstrual cycle she takes once or twice per week.  Will not retest for iron studies today. She will take iron for next 2 months and return for labs.   Menstrual cycles have changed recently. Reports heavy cycles. Changes pad every hour or sooner the first couple of days. Has IUD. Will send GYN referral today for further evaluation.   Seeing therapist. Living situation is stressful. She is working to get place of her own.     ROS    Objective:     BP (!) 93/58   Pulse 71   Temp 98.6 F (37 C) (Oral)   Resp 18   Ht 5\' 4"  (1.626 m)   Wt 142 lb 1.6 oz (64.5 kg)   LMP 07/05/2023 (Exact Date)   SpO2 98%   BMI 24.39 kg/m  BP Readings from Last 3 Encounters:  07/06/23 (!) 93/58  04/06/23 99/65  03/09/23 94/63      Physical Exam Vitals and nursing note reviewed.  Constitutional:      General: She is not in acute distress.    Appearance: Normal appearance. She is normal weight.  Cardiovascular:     Rate and Rhythm: Normal rate.  Pulmonary:     Effort: Pulmonary effort is normal.  Skin:    General: Skin is warm and dry.  Neurological:     General: No focal deficit present.     Mental Status: She is alert. Mental status is at baseline.  Psychiatric:        Mood and Affect: Mood normal.        Behavior: Behavior normal.        Thought Content: Thought content normal.        Judgment: Judgment normal.     No results found for any visits on 07/06/23.    The ASCVD Risk score (Arnett DK, et al., 2019) failed to calculate  for the following reasons:   The 2019 ASCVD risk score is only valid for ages 84 to 27    Assessment & Plan:   Problem List Items Addressed This Visit     Iron deficiency - Primary    Did not  understand to take iron supplement daily. Has been taking during menstrual cycle mostly. Will not test today. She will take daily for next 2 months and return for follow-up. No refill needed.       Menorrhagia with irregular cycle    Heavy and irregular menstrual cycles. Will send to GYN for evaluation.       Relevant Orders   Ambulatory referral to Gynecology  Agrees with plan of care discussed.  Questions answered.   Return in about 2 months (around 09/06/2023) for iron deficiency .    Novella Olive, FNP

## 2023-07-06 NOTE — Assessment & Plan Note (Signed)
Did not  understand to take iron supplement daily. Has been taking during menstrual cycle mostly. Will not test today. She will take daily for next 2 months and return for follow-up. No refill needed.

## 2023-07-06 NOTE — Assessment & Plan Note (Signed)
Heavy and irregular menstrual cycles. Will send to GYN for evaluation.

## 2023-07-18 ENCOUNTER — Encounter: Payer: Self-pay | Admitting: Family Medicine

## 2023-07-18 ENCOUNTER — Ambulatory Visit (HOSPITAL_COMMUNITY): Payer: Medicaid Other | Admitting: Licensed Clinical Social Worker

## 2023-08-14 DIAGNOSIS — Z79899 Other long term (current) drug therapy: Secondary | ICD-10-CM | POA: Diagnosis not present

## 2023-08-14 DIAGNOSIS — R7989 Other specified abnormal findings of blood chemistry: Secondary | ICD-10-CM | POA: Diagnosis not present

## 2023-08-17 ENCOUNTER — Telehealth: Payer: Self-pay

## 2023-08-17 NOTE — Telephone Encounter (Signed)
Copied from CRM [id number]. Topic: Clinical - Medical Advice >> Aug 17, 2023  2:52 PM Suzette B wrote: Reason for CRM: Patient has called to inform the provider that she was in the ER last night and needed someone to call her so that she can explain what's going on [phone]

## 2023-08-18 ENCOUNTER — Encounter: Payer: Self-pay | Admitting: Family Medicine

## 2023-08-24 ENCOUNTER — Ambulatory Visit (INDEPENDENT_AMBULATORY_CARE_PROVIDER_SITE_OTHER): Payer: Medicaid Other | Admitting: Licensed Clinical Social Worker

## 2023-08-24 DIAGNOSIS — Z62819 Personal history of unspecified abuse in childhood: Secondary | ICD-10-CM | POA: Diagnosis not present

## 2023-08-24 DIAGNOSIS — F4323 Adjustment disorder with mixed anxiety and depressed mood: Secondary | ICD-10-CM | POA: Diagnosis not present
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
## 2023-08-29 ENCOUNTER — Encounter: Payer: Medicaid Other | Admitting: Obstetrics and Gynecology

## 2023-09-05 ENCOUNTER — Ambulatory Visit (INDEPENDENT_AMBULATORY_CARE_PROVIDER_SITE_OTHER): Payer: Medicaid Other | Admitting: Family Medicine

## 2023-09-05 ENCOUNTER — Ambulatory Visit: Payer: Medicaid Other | Admitting: Obstetrics and Gynecology

## 2023-09-05 ENCOUNTER — Encounter: Payer: Self-pay | Admitting: Obstetrics and Gynecology

## 2023-09-05 VITALS — BP 88/54 | HR 65 | Temp 98.5°F | Resp 18 | Ht 64.0 in | Wt 145.1 lb

## 2023-09-05 VITALS — BP 101/63 | HR 58 | Resp 16 | Ht 64.0 in | Wt 143.0 lb

## 2023-09-05 DIAGNOSIS — E611 Iron deficiency: Secondary | ICD-10-CM

## 2023-09-05 DIAGNOSIS — R109 Unspecified abdominal pain: Secondary | ICD-10-CM | POA: Diagnosis not present

## 2023-09-05 DIAGNOSIS — Z7689 Persons encountering health services in other specified circumstances: Secondary | ICD-10-CM

## 2023-09-05 DIAGNOSIS — Z3009 Encounter for other general counseling and advice on contraception: Secondary | ICD-10-CM

## 2023-09-05 NOTE — Assessment & Plan Note (Signed)
Taking ferrous sulfate 325 mg daily as prescribed.  Trying to eat iron rich diet.  Will check iron study today. Follow-up based on results. Refill will be sent if needed.

## 2023-09-05 NOTE — Progress Notes (Signed)
 Established Patient Office Visit  Subjective   Patient ID: Carrie Bell, female    DOB: [birth year]  Age: 38 y.o. MRN: [id number]  Chief Complaint  Patient presents with   Medical Management of Chronic Issues    Patient is here for a 1 month follow up for Iron  Deficiency    HPI  Iron  deficiency: Taking ferrous sulfate  325 mg daily.  Trying to increase iron  rich foods.   ED visit: 08/13/23- 08/15/23 Abdominal pain and diarrhea. Went to [place].  Ultrasound showed fluid around gallbladder.  HIDA scan does not show gallbladder issues. Symptoms have improved since then. No abdominal pain or diarrhea.  Denies symptoms when eating food or fatty foods.   She will sign up for [facility] patient portal to show results to this provider. Okay to send through My Chart portal.    Review of Systems  Constitutional:  Negative for chills and fever.  Gastrointestinal:  Negative for abdominal pain, diarrhea, nausea and vomiting.      Objective:     BP (!) 88/54   Pulse 65   Temp 98.5 F (36.9 C) (Oral)   Resp 18   Ht 5' 4 (1.626 m)   Wt 145 lb 1.6 oz (65.8 kg)   SpO2 99%   BMI 24.91 kg/m    Physical Exam Vitals and nursing note reviewed.  Constitutional:      General: She is not in acute distress.    Appearance: Normal appearance.  Cardiovascular:     Rate and Rhythm: Regular rhythm.     Heart sounds: Normal heart sounds.  Pulmonary:     Effort: Pulmonary effort is normal.     Breath sounds: Normal breath sounds.  Abdominal:     General: Abdomen is flat. Bowel sounds are normal.     Palpations: Abdomen is soft.     Tenderness: There is no abdominal tenderness.  Skin:    General: Skin is warm and dry.  Neurological:     General: No focal deficit present.     Mental Status: She is alert. Mental status is at baseline.  Psychiatric:        Mood and Affect: Mood normal.        Behavior: Behavior normal.        Thought Content: Thought content normal.         Judgment: Judgment normal.     No results found for any visits on 09/05/23.    The ASCVD Risk score (Arnett DK, et al., 2019) failed to calculate for the following reasons:   The 2019 ASCVD risk score is only valid for ages 17 to 26    Assessment & Plan:   Problem List Items Addressed This Visit     Iron  deficiency - Primary   Taking ferrous sulfate  325 mg daily as prescribed.  Trying to eat iron  rich diet.  Will check iron  study today. Follow-up based on results. Refill will be sent if needed.       Relevant Orders   Iron , TIBC and Ferritin Panel   Abdominal pain   Went to [facility] 1/12-1/14 for abdominal pain and diarrhea. She does not have results of test with her today. She will work on getting these results. Reports gallbladder work-up was negative. Pain and diarrhea have resolved. There is no abdominal tenderness upon palpation today. Denies nausea, vomiting, diarrhea, fever or chills.  Will continue to monitor. [place] recommended repeat CBC and CMP at this visit.  She will notify  provider if she starts to have symptoms again.       Relevant Orders   CBC with Differential/Platelet   Comprehensive metabolic panel  Agrees with plan of care discussed.  Questions answered.   Return if symptoms worsen or fail to improve.    Darice JONELLE Brownie, FNP

## 2023-09-05 NOTE — Assessment & Plan Note (Signed)
 Went to [facility] 1/12-1/14 for abdominal pain and diarrhea. She does not have results of test with her today. She will work on getting these results. Reports gallbladder work-up was negative. Pain and diarrhea have resolved. There is no abdominal tenderness upon palpation today. Denies nausea, vomiting, diarrhea, fever or chills.  Will continue to monitor. [place] recommended repeat CBC and CMP at this visit.  She will notify provider if she starts to have symptoms again.

## 2023-09-06 ENCOUNTER — Other Ambulatory Visit: Payer: Self-pay | Admitting: Family Medicine

## 2023-09-06 ENCOUNTER — Encounter: Payer: Self-pay | Admitting: Family Medicine

## 2023-09-06 DIAGNOSIS — E611 Iron deficiency: Secondary | ICD-10-CM

## 2023-09-06 LAB — CBC WITH DIFFERENTIAL/PLATELET
Basophils Absolute: 0 10*3/uL (ref 0.0–0.2)
Basos: 1 %
EOS (ABSOLUTE): 0.2 10*3/uL (ref 0.0–0.4)
Eos: 3 %
Hematocrit: 35.6 % (ref 34.0–46.6)
Hemoglobin: 11.7 g/dL (ref 11.1–15.9)
Immature Grans (Abs): 0 10*3/uL (ref 0.0–0.1)
Immature Granulocytes: 0 %
Lymphocytes Absolute: 1.4 10*3/uL (ref 0.7–3.1)
Lymphs: 32 %
MCH: 30.8 pg (ref 26.6–33.0)
MCHC: 32.9 g/dL (ref 31.5–35.7)
MCV: 94 fL (ref 79–97)
Monocytes Absolute: 0.4 10*3/uL (ref 0.1–0.9)
Monocytes: 9 %
Neutrophils Absolute: 2.4 10*3/uL (ref 1.4–7.0)
Neutrophils: 55 %
Platelets: 176 10*3/uL (ref 150–450)
RBC: 3.8 x10E6/uL (ref 3.77–5.28)
RDW: 12.3 % (ref 11.7–15.4)
WBC: 4.4 10*3/uL (ref 3.4–10.8)

## 2023-09-06 LAB — IRON,TIBC AND FERRITIN PANEL
Ferritin: 28 ng/mL (ref 15–150)
Iron Saturation: 14 % — ABNORMAL LOW (ref 15–55)
Iron: 44 ug/dL (ref 27–159)
Total Iron Binding Capacity: 305 ug/dL (ref 250–450)
UIBC: 261 ug/dL (ref 131–425)

## 2023-09-06 LAB — COMPREHENSIVE METABOLIC PANEL
ALT: 13 [IU]/L (ref 0–32)
AST: 21 [IU]/L (ref 0–40)
Albumin: 4.1 g/dL (ref 3.9–4.9)
Alkaline Phosphatase: 45 [IU]/L (ref 44–121)
BUN/Creatinine Ratio: 16 (ref 9–23)
BUN: 12 mg/dL (ref 6–20)
Bilirubin Total: 0.2 mg/dL (ref 0.0–1.2)
CO2: 25 mmol/L (ref 20–29)
Calcium: 8.6 mg/dL — ABNORMAL LOW (ref 8.7–10.2)
Chloride: 102 mmol/L (ref 96–106)
Creatinine, Ser: 0.75 mg/dL (ref 0.57–1.00)
Globulin, Total: 2.1 g/dL (ref 1.5–4.5)
Glucose: 81 mg/dL (ref 70–99)
Potassium: 5.1 mmol/L (ref 3.5–5.2)
Sodium: 139 mmol/L (ref 134–144)
Total Protein: 6.2 g/dL (ref 6.0–8.5)
eGFR: 105 mL/min/{1.73_m2} (ref >59)

## 2023-09-06 MED ORDER — IRON (FERROUS SULFATE) 325 (65 FE) MG PO TABS: 325.0000 mg | ORAL_TABLET | Freq: Every day | ORAL | 0 refills | Status: DC

## 2023-09-07 ENCOUNTER — Ambulatory Visit (INDEPENDENT_AMBULATORY_CARE_PROVIDER_SITE_OTHER): Payer: Medicaid Other | Admitting: Licensed Clinical Social Worker

## 2023-09-07 DIAGNOSIS — Z62819 Personal history of unspecified abuse in childhood: Secondary | ICD-10-CM | POA: Diagnosis not present

## 2023-09-07 DIAGNOSIS — F4323 Adjustment disorder with mixed anxiety and depressed mood: Secondary | ICD-10-CM | POA: Diagnosis not present
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
## 2023-09-11 ENCOUNTER — Encounter: Payer: Self-pay | Admitting: Internal Medicine

## 2023-09-21 ENCOUNTER — Ambulatory Visit (INDEPENDENT_AMBULATORY_CARE_PROVIDER_SITE_OTHER): Payer: Medicaid Other | Admitting: Licensed Clinical Social Worker

## 2023-09-21 DIAGNOSIS — F4323 Adjustment disorder with mixed anxiety and depressed mood: Secondary | ICD-10-CM

## 2023-09-21 DIAGNOSIS — Z62819 Personal history of unspecified abuse in childhood: Secondary | ICD-10-CM
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
## 2023-09-27 ENCOUNTER — Other Ambulatory Visit: Payer: Self-pay | Admitting: Family Medicine

## 2023-09-27 DIAGNOSIS — E611 Iron deficiency: Secondary | ICD-10-CM

## 2023-10-02 ENCOUNTER — Other Ambulatory Visit: Payer: Self-pay | Admitting: Family Medicine

## 2023-10-02 DIAGNOSIS — E611 Iron deficiency: Secondary | ICD-10-CM

## 2023-10-02 NOTE — Telephone Encounter (Signed)
 Patient informed that should already have a refill at pharmacy from 09/06/23 as 90 day supply. She will contact her pharmacy to inquire about this.

## 2023-10-05 ENCOUNTER — Ambulatory Visit (HOSPITAL_COMMUNITY): Payer: Medicaid Other | Admitting: Licensed Clinical Social Worker

## 2023-10-05 DIAGNOSIS — F4323 Adjustment disorder with mixed anxiety and depressed mood: Secondary | ICD-10-CM

## 2023-10-05 DIAGNOSIS — Z62819 Personal history of unspecified abuse in childhood: Secondary | ICD-10-CM
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
## 2023-10-19 ENCOUNTER — Ambulatory Visit (HOSPITAL_COMMUNITY): Payer: Medicaid Other | Admitting: Licensed Clinical Social Worker

## 2023-11-02 ENCOUNTER — Ambulatory Visit (INDEPENDENT_AMBULATORY_CARE_PROVIDER_SITE_OTHER): Payer: Medicaid Other | Admitting: Licensed Clinical Social Worker

## 2023-11-02 DIAGNOSIS — F4323 Adjustment disorder with mixed anxiety and depressed mood: Secondary | ICD-10-CM

## 2023-11-02 DIAGNOSIS — Z62819 Personal history of unspecified abuse in childhood: Secondary | ICD-10-CM
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
## 2023-11-16 ENCOUNTER — Ambulatory Visit (INDEPENDENT_AMBULATORY_CARE_PROVIDER_SITE_OTHER): Payer: Medicaid Other | Admitting: Licensed Clinical Social Worker

## 2023-11-16 DIAGNOSIS — Z62819 Personal history of unspecified abuse in childhood: Secondary | ICD-10-CM | POA: Diagnosis not present

## 2023-11-16 DIAGNOSIS — F4323 Adjustment disorder with mixed anxiety and depressed mood: Secondary | ICD-10-CM
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
## 2023-11-30 ENCOUNTER — Ambulatory Visit (INDEPENDENT_AMBULATORY_CARE_PROVIDER_SITE_OTHER): Admitting: Licensed Clinical Social Worker

## 2023-11-30 DIAGNOSIS — Z62819 Personal history of unspecified abuse in childhood: Secondary | ICD-10-CM | POA: Diagnosis not present

## 2023-11-30 DIAGNOSIS — F4323 Adjustment disorder with mixed anxiety and depressed mood: Secondary | ICD-10-CM
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
## 2023-12-04 ENCOUNTER — Other Ambulatory Visit: Payer: Self-pay | Admitting: Family Medicine

## 2023-12-04 ENCOUNTER — Encounter: Payer: Self-pay | Admitting: Family Medicine

## 2023-12-04 DIAGNOSIS — E611 Iron deficiency: Secondary | ICD-10-CM

## 2023-12-08 ENCOUNTER — Encounter: Payer: Self-pay | Admitting: Family Medicine

## 2023-12-08 ENCOUNTER — Other Ambulatory Visit: Payer: Self-pay | Admitting: Family Medicine

## 2023-12-08 DIAGNOSIS — E611 Iron deficiency: Secondary | ICD-10-CM

## 2023-12-08 LAB — IRON,TIBC AND FERRITIN PANEL
Ferritin: 50 ng/mL (ref 15–150)
Iron Saturation: 17 % (ref 15–55)
Iron: 55 ug/dL (ref 27–159)
Total Iron Binding Capacity: 331 ug/dL (ref 250–450)
UIBC: 276 ug/dL (ref 131–425)

## 2023-12-08 MED ORDER — IRON (FERROUS SULFATE) 325 (65 FE) MG PO TABS
325.0000 mg | ORAL_TABLET | Freq: Every day | ORAL | 0 refills | Status: DC
Start: 2023-12-08 — End: 2024-03-08

## 2024-01-02 ENCOUNTER — Ambulatory Visit (INDEPENDENT_AMBULATORY_CARE_PROVIDER_SITE_OTHER): Admitting: Licensed Clinical Social Worker

## 2024-01-02 DIAGNOSIS — Z62819 Personal history of unspecified abuse in childhood: Secondary | ICD-10-CM | POA: Diagnosis not present

## 2024-01-02 DIAGNOSIS — F4323 Adjustment disorder with mixed anxiety and depressed mood: Secondary | ICD-10-CM
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
## 2024-01-08 ENCOUNTER — Ambulatory Visit (HOSPITAL_COMMUNITY): Admitting: Licensed Clinical Social Worker

## 2024-01-22 ENCOUNTER — Other Ambulatory Visit: Payer: Self-pay | Admitting: Family Medicine

## 2024-01-22 ENCOUNTER — Encounter: Payer: Self-pay | Admitting: Family Medicine

## 2024-01-22 DIAGNOSIS — Z809 Family history of malignant neoplasm, unspecified: Secondary | ICD-10-CM | POA: Insufficient documentation

## 2024-01-25 ENCOUNTER — Ambulatory Visit (INDEPENDENT_AMBULATORY_CARE_PROVIDER_SITE_OTHER): Admitting: Licensed Clinical Social Worker

## 2024-01-25 DIAGNOSIS — Z62819 Personal history of unspecified abuse in childhood: Secondary | ICD-10-CM

## 2024-01-25 DIAGNOSIS — F4323 Adjustment disorder with mixed anxiety and depressed mood: Secondary | ICD-10-CM | POA: Diagnosis not present
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
## 2024-03-04 ENCOUNTER — Encounter: Payer: Self-pay | Admitting: Family Medicine

## 2024-03-04 ENCOUNTER — Other Ambulatory Visit: Payer: Self-pay | Admitting: Family Medicine

## 2024-03-04 DIAGNOSIS — E611 Iron deficiency: Secondary | ICD-10-CM

## 2024-03-08 ENCOUNTER — Ambulatory Visit: Payer: Self-pay | Admitting: Family Medicine

## 2024-03-08 ENCOUNTER — Other Ambulatory Visit: Payer: Self-pay | Admitting: Family Medicine

## 2024-03-08 DIAGNOSIS — E611 Iron deficiency: Secondary | ICD-10-CM

## 2024-03-08 LAB — IRON,TIBC AND FERRITIN PANEL
Ferritin: 46 ng/mL (ref 15–150)
Iron Saturation: 15 % (ref 15–55)
Iron: 53 ug/dL (ref 27–159)
Total Iron Binding Capacity: 343 ug/dL (ref 250–450)
UIBC: 290 ug/dL (ref 131–425)

## 2024-03-08 MED ORDER — IRON (FERROUS SULFATE) 325 (65 FE) MG PO TABS: ORAL_TABLET | ORAL | 0 refills | Status: DC

## 2024-05-09 ENCOUNTER — Encounter: Payer: Self-pay | Admitting: Family Medicine

## 2024-05-09 DIAGNOSIS — B029 Zoster without complications: Secondary | ICD-10-CM

## 2024-05-10 MED ORDER — VALACYCLOVIR HCL 1 G PO TABS: 1000.0000 mg | ORAL_TABLET | Freq: Three times a day (TID) | ORAL | 0 refills | Status: AC

## 2024-05-10 NOTE — Telephone Encounter (Signed)
 It does look like shingesl

## 2024-05-10 NOTE — Telephone Encounter (Signed)

## 2024-05-10 NOTE — Addendum Note (Signed)
 Addended by: Adalbert Alberto on: 05/10/2024 01:27 PM   Modules accepted: Orders

## 2024-05-10 NOTE — Telephone Encounter (Signed)
 Left a message for the patient to call back to schedule an appointment for the rash.

## 2024-05-10 NOTE — Telephone Encounter (Signed)
 Needs appt for rash

## 2024-05-13 ENCOUNTER — Ambulatory Visit (INDEPENDENT_AMBULATORY_CARE_PROVIDER_SITE_OTHER): Admitting: Medical-Surgical

## 2024-05-13 VITALS — BP 102/67 | HR 86 | Temp 99.5°F | Resp 18 | Ht 64.0 in | Wt 139.7 lb

## 2024-05-13 DIAGNOSIS — B029 Zoster without complications: Secondary | ICD-10-CM

## 2024-05-13 NOTE — Patient Instructions (Signed)
 Shingles  Shingles, or herpes zoster, is an infection. It gives you a skin rash and blisters. These infected areas may hurt a lot. Shingles only happens if: You've had chickenpox. You've been given a shot called a vaccine to protect you from getting chickenpox. Shingles is rare in this case. What are the causes? Shingles is caused by a germ called the varicella-zoster virus. This is the same germ that causes chickenpox. After you're exposed to the germ, it stays in your body but is dormant. This means it isn't active. Shingles happens if the germ becomes active again. This can happen years after you're first exposed to the germ. What increases the risk? You may be more likely to get shingles if: You're older than 38 years of age. You're under a lot of stress. You have a weak immune system. The immune system is your body's defense system. It may be weak if: You have human immunodeficiency virus (HIV). You have acquired immunodeficiency syndrome (AIDS). You have cancer. You take medicines that weaken your immune system. These include organ transplant medicines. What are the signs or symptoms? The first symptoms of shingles may be itching, tingling, or pain. Your skin may feel like it's burning. A few days or weeks later, you'll get a rash. Here's what you can expect: The rash is likely to be on one side of your body. The rash may be shaped like a belt or a band. Over time, it will turn into blisters filled with fluid. The blisters will break open and change into scabs. The scabs will dry up in about 2-3 weeks. You may also have: A fever. Chills. A headache. Nausea. How is this diagnosed? Shingles is diagnosed with a skin exam. A sample called a culture may be taken from one of your blisters and sent to a lab. This will show if you have shingles. How is this treated? The rash may last for several weeks. There's no cure for shingles, but your health care provider may give you medicines.  These medicines may: Help with pain. Help with itching. Help with irritation and swelling. Help you get better sooner. Help to prevent long-term problems. If the rash is on your face, you may need to see an eye doctor or an ear, nose, and throat (ENT) doctor. Follow these instructions at home: Medicines Take your medicines only as told by your provider. Put an anti-itch cream or numbing cream on the rash or blisters as told by your provider. Relieving itching and discomfort  To help with itching: Put cold, wet cloths called cold compresses on the rash or blisters. Take a cool bath. Try adding baking soda or dry oatmeal to the water. Do not bathe in hot water. Use calamine lotion on the rash or blisters. You can get this type of lotion at the store. Blister and rash care Keep your rash covered with a loose bandage. Wear loose clothes that don't rub on your rash. Take care of your rash as told by your provider. Make sure you: Wash your hands with soap and water for at least 20 seconds before and after you change your bandage. If you can't use soap and water, use hand sanitizer. Keep your rash and blisters clean by washing them with mild soap and cool water. Change your bandage. Check your rash every day for signs of infection. Check for: More redness, swelling, or pain. Fluid or blood. Warmth. Pus or a bad smell. Do not scratch your rash. Do not pick at your  blisters. To help you not scratch: Keep your fingernails clean and cut short. Try to wear gloves or mittens when you sleep. General instructions Rest. Wash your hands often with soap and water for at least 20 seconds. If you can't use soap and water, use hand sanitizer. Washing your hands lowers your chance of getting a skin infection. Your infection can cause chickenpox in others. If you have blisters that aren't scabs yet, stay away from: Babies. Pregnant people. Children who have eczema. Older people who have organ  transplants. People who have a long-term, or chronic, illness. Anyone who hasn't had chickenpox before. Anyone who hasn't gotten the chickenpox vaccine. How is this prevented? Vaccines are the best way to prevent you from getting chickenpox or shingles. Talk with your provider about getting these shots. Where to find more information Centers for Disease Control and Prevention (CDC): [employer]no Contact a health care provider if: Your pain doesn't get better with medicine. Your pain doesn't get better after the rash heals. You have any signs of infection around the rash. Your rash or blisters get worse. You have a fever or chills. Get help right away if: The rash is on your face or nose. You have pain in your face or by your eye. You lose feeling on one side of your face. You have trouble seeing. You have ear pain or ringing in your ear. This information is not intended to replace advice given to you by your health care provider. Make sure you discuss any questions you have with your health care provider. Document Revised: 04/20/2023 Document Reviewed: 09/02/2022 Elsevier Patient Education  2024 [employer].

## 2024-05-13 NOTE — Progress Notes (Signed)
        Established patient visit   History of Present Illness   Discussed the use of AI scribe software for clinical note transcription with the patient, who gave verbal consent to proceed.  History of Present Illness   Carrie Bell is a 38 year old female who presents with a rash suspected to be shingles.  Vesicular rash - Onset Friday morning as a small lesion at the bra line - Progressed to additional spots in the same area without spreading to other regions - Initially asymptomatic, later developed pruritus and stinging sensation - Symptoms intensified with onset of menstrual cycle - Rash remains localized and covered  Antiviral therapy - Initiated Valtrex on Friday night - No adverse effects from medication  Psychological stress - Undergoing therapy for PTSD - Experiencing significant stress, perceived as a possible trigger for viral reactivation   Physical Exam   Physical Exam Vitals reviewed.  Constitutional:      General: She is not in acute distress.    Appearance: Normal appearance.  HENT:     Head: Normocephalic and atraumatic.  Cardiovascular:     Rate and Rhythm: Normal rate and regular rhythm.     Pulses: Normal pulses.     Heart sounds: Normal heart sounds. No murmur heard.    No friction rub. No gallop.  Pulmonary:     Effort: Pulmonary effort is normal. No respiratory distress.     Breath sounds: Normal breath sounds. No wheezing.  Skin:    General: Skin is warm and dry.     Findings: Lesion (erythematous vesicular rash on dark pink base in ovoid shape on the right ribs just below the bra line; additional small cluster on the right abdomen) present.  Neurological:     Mental Status: She is alert and oriented to person, place, and time.  Psychiatric:        Mood and Affect: Mood normal.        Behavior: Behavior normal.        Thought Content: Thought content normal.        Judgment: Judgment normal.    Assessment & Plan     Shingles (Herpes  Zoster) Rash consistent with shingles, likely reactivated due to stress. Differential diagnosis of spider bite ruled out. Symptoms intensified with menstrual cycle. Tolerating Valtrex well. - Continue Valtrex 3 times daily for the full 7-day course. - Cover rash with gauze or ABD pads to prevent spread, especially around her son. - Avoid Neosporin on rash. - Clean rash daily with warm soapy water, rinse, pat dry. - Consider OTC lidocaine  cream or patches for itching or burning. - Use calamine lotion for relief. - Provided information on shingles and prevention of spreading.     Follow up   Return if symptoms worsen or fail to improve. __________________________________ Zada FREDRIK Palin, DNP, APRN, FNP-BC Primary Care and Sports Medicine [facility] [place]

## 2024-06-04 ENCOUNTER — Encounter: Payer: Self-pay | Admitting: Obstetrics and Gynecology

## 2024-06-04 ENCOUNTER — Ambulatory Visit (INDEPENDENT_AMBULATORY_CARE_PROVIDER_SITE_OTHER): Admitting: Obstetrics and Gynecology

## 2024-06-04 VITALS — BP 119/73 | HR 79 | Ht 65.0 in | Wt 141.1 lb

## 2024-06-04 DIAGNOSIS — Z975 Presence of (intrauterine) contraceptive device: Secondary | ICD-10-CM

## 2024-06-04 DIAGNOSIS — Z3202 Encounter for pregnancy test, result negative: Secondary | ICD-10-CM

## 2024-06-04 DIAGNOSIS — Z30433 Encounter for removal and reinsertion of intrauterine contraceptive device: Secondary | ICD-10-CM

## 2024-06-04 LAB — POCT URINE PREGNANCY: Preg Test, Ur: NEGATIVE

## 2024-06-04 MED ORDER — LEVONORGESTREL 20 MCG/DAY IU IUD
1.0000 | INTRAUTERINE_SYSTEM | Freq: Once | INTRAUTERINE | Status: AC
  Administered 2024-06-04: 1 via INTRAUTERINE

## 2024-06-11 NOTE — Progress Notes (Signed)
    GYNECOLOGY OFFICE PROCEDURE NOTE  Nikiyah Fackler is a 38 y.o. [id number] here for copper IUD removal and Mirena IUD reinsertion. No GYN concerns.  Last pap smear was on unknown and was normal. Will schedule.   IUD Removal and Reinsertion  Patient identified, informed consent performed, consent signed.   Chaperone present.   Discussed risks of irregular bleeding, cramping, infection, malpositioning or misplacement of the IUD outside the uterus which may require further procedures. Also discussed >99% contraception efficacy, increased risk of ectopic pregnancy with failure of method.   Emphasized that this did not protect against STIs, condoms recommended during all sexual encounters.Advised to use backup contraception for one week as the risk of pregnancy is higher during the transition period of removing an IUD and replacing it with another one. Time out was performed. Speculum placed in the vagina. The strings of the IUD were grasped and pulled using ring forceps. The IUD was successfully removed in its entirety. The cervix was cleaned with Betadine x 2 and grasped anteriorly with a single tooth tenaculum.  The new Mirena IUD insertion apparatus was used to sound the uterus to 7 cm;  the IUD was then placed per manufacturer's recommendations. Strings trimmed to 3 cm. Tenaculum was removed, good hemostasis noted. Patient tolerated procedure well.   Patient was given post-procedure instructions.  She was reminded to have backup contraception for one week during this transition period between IUDs.  Patient was also asked to check IUD strings periodically and follow up as needed.   Kshawn Canal, Delon FERNS, NP [facility] [employer], [facility]

## 2024-06-12 ENCOUNTER — Other Ambulatory Visit: Payer: Self-pay | Admitting: Family Medicine

## 2024-06-12 DIAGNOSIS — E611 Iron deficiency: Secondary | ICD-10-CM
